# Patient Record
Sex: Female | Born: 2005 | Race: Black or African American | Hispanic: No | Marital: Single | Smoking: Never smoker
Health system: Southern US, Community
[De-identification: ages and names within clinical notes are randomized; demographics above are authoritative.]

## PROBLEM LIST (undated history)

## (undated) HISTORY — PX: TONSILLECTOMY: SUR1361

## (undated) HISTORY — PX: ADENOIDECTOMY: SUR15

---
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
## 2024-02-28 ENCOUNTER — Encounter (INDEPENDENT_AMBULATORY_CARE_PROVIDER_SITE_OTHER): Payer: Self-pay

## 2024-03-12 ENCOUNTER — Encounter (INDEPENDENT_AMBULATORY_CARE_PROVIDER_SITE_OTHER): Payer: Self-pay
